# Patient Record
Sex: Female | Born: 1935 | Race: White | Hispanic: No | Smoking: Former smoker
Health system: Southern US, Community
[De-identification: ages and names within clinical notes are randomized; demographics above are authoritative.]

## PROBLEM LIST (undated history)

## (undated) DIAGNOSIS — I10 Essential (primary) hypertension: Secondary | ICD-10-CM

## (undated) DIAGNOSIS — I2699 Other pulmonary embolism without acute cor pulmonale: Secondary | ICD-10-CM

## (undated) HISTORY — PX: REPLACEMENT TOTAL KNEE: SUR1224

## (undated) HISTORY — PX: BACK SURGERY: SHX140

---

## 2019-03-20 ENCOUNTER — Other Ambulatory Visit: Payer: Self-pay | Admitting: Physician Assistant

## 2019-03-20 DIAGNOSIS — S22000A Wedge compression fracture of unspecified thoracic vertebra, initial encounter for closed fracture: Secondary | ICD-10-CM

## 2019-03-28 ENCOUNTER — Other Ambulatory Visit: Payer: Self-pay

## 2020-01-03 ENCOUNTER — Encounter (HOSPITAL_BASED_OUTPATIENT_CLINIC_OR_DEPARTMENT_OTHER): Payer: Self-pay

## 2020-01-03 ENCOUNTER — Emergency Department (HOSPITAL_BASED_OUTPATIENT_CLINIC_OR_DEPARTMENT_OTHER): Payer: Medicare Other

## 2020-01-03 ENCOUNTER — Other Ambulatory Visit: Payer: Self-pay

## 2020-01-03 ENCOUNTER — Emergency Department (HOSPITAL_BASED_OUTPATIENT_CLINIC_OR_DEPARTMENT_OTHER)
Admission: EM | Admit: 2020-01-03 | Discharge: 2020-01-03 | Disposition: A | Discharge status: Home / Self Care | Payer: Medicare Other | Attending: Emergency Medicine | Admitting: Emergency Medicine

## 2020-01-03 DIAGNOSIS — R2241 Localized swelling, mass and lump, right lower limb: Secondary | ICD-10-CM | POA: Diagnosis present

## 2020-01-03 DIAGNOSIS — Z86711 Personal history of pulmonary embolism: Secondary | ICD-10-CM | POA: Diagnosis not present

## 2020-01-03 DIAGNOSIS — Z7901 Long term (current) use of anticoagulants: Secondary | ICD-10-CM | POA: Insufficient documentation

## 2020-01-03 DIAGNOSIS — I10 Essential (primary) hypertension: Secondary | ICD-10-CM | POA: Insufficient documentation

## 2020-01-03 DIAGNOSIS — R609 Edema, unspecified: Secondary | ICD-10-CM

## 2020-01-03 DIAGNOSIS — M7989 Other specified soft tissue disorders: Secondary | ICD-10-CM

## 2020-01-03 DIAGNOSIS — Z87891 Personal history of nicotine dependence: Secondary | ICD-10-CM | POA: Insufficient documentation

## 2020-01-03 DIAGNOSIS — Z79899 Other long term (current) drug therapy: Secondary | ICD-10-CM | POA: Diagnosis not present

## 2020-01-03 HISTORY — DX: Other pulmonary embolism without acute cor pulmonale: I26.99

## 2020-01-03 HISTORY — DX: Essential (primary) hypertension: I10

## 2020-01-03 LAB — CBC WITH DIFFERENTIAL/PLATELET
Abs Immature Granulocytes: 0.05 10*3/uL (ref 0.00–0.07)
Basophils Absolute: 0 10*3/uL (ref 0.0–0.1)
Basophils Relative: 1 %
Eosinophils Absolute: 0.3 10*3/uL (ref 0.0–0.5)
Eosinophils Relative: 4 %
HCT: 30.6 % — ABNORMAL LOW (ref 36.0–46.0)
Hemoglobin: 10.3 g/dL — ABNORMAL LOW (ref 12.0–15.0)
Immature Granulocytes: 1 %
Lymphocytes Relative: 23 %
Lymphs Abs: 1.5 10*3/uL (ref 0.7–4.0)
MCH: 31.4 pg (ref 26.0–34.0)
MCHC: 33.7 g/dL (ref 30.0–36.0)
MCV: 93.3 fL (ref 80.0–100.0)
Monocytes Absolute: 0.7 10*3/uL (ref 0.1–1.0)
Monocytes Relative: 11 %
Neutro Abs: 4 10*3/uL (ref 1.7–7.7)
Neutrophils Relative %: 60 %
Platelets: 176 10*3/uL (ref 150–400)
RBC: 3.28 MIL/uL — ABNORMAL LOW (ref 3.87–5.11)
RDW: 14 % (ref 11.5–15.5)
WBC: 6.5 10*3/uL (ref 4.0–10.5)
nRBC: 0 % (ref 0.0–0.2)

## 2020-01-03 LAB — COMPREHENSIVE METABOLIC PANEL
ALT: 30 U/L (ref 0–44)
AST: 27 U/L (ref 15–41)
Albumin: 4 g/dL (ref 3.5–5.0)
Alkaline Phosphatase: 82 U/L (ref 38–126)
Anion gap: 9 (ref 5–15)
BUN: 32 mg/dL — ABNORMAL HIGH (ref 8–23)
CO2: 26 mmol/L (ref 22–32)
Calcium: 9.3 mg/dL (ref 8.9–10.3)
Chloride: 99 mmol/L (ref 98–111)
Creatinine, Ser: 0.97 mg/dL (ref 0.44–1.00)
GFR calc Af Amer: >60 mL/min (ref >60)
GFR calc non Af Amer: 54 mL/min — ABNORMAL LOW (ref >60)
Glucose, Bld: 102 mg/dL — ABNORMAL HIGH (ref 70–99)
Potassium: 5.1 mmol/L (ref 3.5–5.1)
Sodium: 134 mmol/L — ABNORMAL LOW (ref 135–145)
Total Bilirubin: 0.5 mg/dL (ref 0.3–1.2)
Total Protein: 6.4 g/dL — ABNORMAL LOW (ref 6.5–8.1)

## 2020-01-03 NOTE — ED Triage Notes (Signed)
Pt c/o intermittent right LE swelling-states she had recent blood clot in leg and lung-NAD-to triage using own rollater

## 2020-01-03 NOTE — ED Provider Notes (Signed)
[facility] EMERGENCY DEPARTMENT Provider Note   CSN: [id number] Arrival date & time: 01/03/20  1520     History Chief Complaint  Patient presents with  . Leg Swelling    Carly Carney is a 86 y.o. female presents today for swelling and color change of the right leg. Patient reports that in January 2021 she had steroid injection of her right knee for pain. Since that time she developed swelling and bruising of the right knee and lower leg. She denies any significant pain of the extremity. Patient reports that over the past couple of weeks the swelling and color change had been improving but they have worsened again starting 1-2 days ago, no clear inciting factors.  Patient reports that she was admitted to the hospital in March 2021 where she was diagnosed with PE. She has discharged on Eliquis and reports compliance with all medications. She reports that an ultrasound was not done of her leg.  Denies fever, chills, chest pain, SOB, cough/hemoptysis, abd pain, n/v/d, numbness, weakness, pain or any additional concerns. HPI     Past Medical History:  Diagnosis Date  . Hypertension   . Pulmonary emboli (HCC)     There are no problems to display for this patient.   Past Surgical History:  Procedure Laterality Date  . BACK SURGERY    . REPLACEMENT TOTAL KNEE       OB History   No obstetric history on file.     No family history on file.  Social History   Tobacco Use  . Smoking status: Former Games developer  . Smokeless tobacco: Never Used  Substance Use Topics  . Alcohol use: Never  . Drug use: Never    Home Medications Prior to Admission medications   Medication Sig Start Date End Date Taking? Authorizing Provider  apixaban (ELIQUIS) 5 MG TABS tablet Take by mouth. 11/15/19  Yes [provider]  Buprenorphine 15 MCG/HR PTWK Place onto the skin. 01/01/20  Yes [provider]  denosumab (PROLIA) 60 MG/ML SOSY injection Inject into the skin. 08/03/17   Yes [provider]  meloxicam (MOBIC) 7.5 MG tablet  12/08/16  Yes [provider]  methocarbamol (ROBAXIN) 500 MG tablet Take by mouth. 10/05/16  Yes [provider]  oxyCODONE-acetaminophen (PERCOCET) 7.5-325 MG tablet Take by mouth. 04/06/17 02/04/20 Yes [provider]  pantoprazole (PROTONIX) 40 MG tablet Take by mouth. 02/10/18 03/26/20 Yes [provider]  ALPRAZolam Prudy Feeler) 0.5 MG tablet SMARTSIG:0.5-1 Tablet(s) By Mouth 1 to 4 Times Daily 12/26/19   [provider]  b complex vitamins tablet Take by mouth.    [provider]  Cholecalciferol 25 MCG (1000 UT) tablet Take by mouth.    [provider]  furosemide (LASIX) 20 MG tablet Take 10 mg by mouth daily. 08/21/19   [provider]  losartan (COZAAR) 100 MG tablet Take 100 mg by mouth daily. 11/29/19   [provider]  metoprolol succinate (TOPROL-XL) 50 MG 24 hr tablet Take 50 mg by mouth 2 (two) times daily. 10/25/19   [provider]  Multiple Vitamins tablet Take by mouth.    [provider]  PREMARIN 0.3 MG tablet Take 0.3 mg by mouth daily. 09/14/19   [provider]    Allergies    Patient has no known allergies.  Review of Systems   Review of Systems Ten systems are reviewed and are negative for acute change except as noted in the HPI   Physical  Exam Updated Vital Signs BP (!) 181/80   Pulse 75   Temp 97.7 F (36.5 C) (Oral)   Resp 16   Ht 5' 3.5" (1.613 m)   Wt 59 kg   SpO2 99%   BMI 22.67 kg/m   Physical Exam Constitutional:      General: She is not in acute distress.    Appearance: Normal appearance. She is well-developed. She is not ill-appearing or diaphoretic.  HENT:     Head: Normocephalic and atraumatic.     Right Ear: External ear normal.     Left Ear: External ear normal.     Nose: Nose normal.  Eyes:     General: Vision grossly intact. Gaze aligned appropriately.     Pupils: Pupils are  equal, round, and reactive to light.  Neck:     Trachea: Trachea and phonation normal. No tracheal deviation.  Pulmonary:     Effort: Pulmonary effort is normal. No respiratory distress.  Abdominal:     General: There is no distension.     Palpations: Abdomen is soft.     Tenderness: There is no abdominal tenderness. There is no guarding or rebound.  Musculoskeletal:        General: No tenderness. Normal range of motion.     Cervical back: Normal range of motion.     Right lower leg: Edema present.     Left lower leg: No edema.     Comments: Swelling and darkening of the right lower leg as pictured below. No induration or fluctuance. No increased warmth. Motion at the right knee appropriate for age without increase in pain. Motion at the right ankle intact and without pain. Capillary refill and sensation intact to all toes. Pedal pulses intact and equal.  Skin:    General: Skin is warm and dry.  Neurological:     Mental Status: She is alert.     GCS: GCS eye subscore is 4. GCS verbal subscore is 5. GCS motor subscore is 6.     Comments: Speech is clear and goal oriented, follows commands Major Cranial nerves without deficit, no facial droop Moves extremities without ataxia, coordination intact  Psychiatric:        Behavior: Behavior normal.       ED Results / Procedures / Treatments   Labs (all labs ordered are listed, but only abnormal results are displayed) Labs Reviewed  CBC WITH DIFFERENTIAL/PLATELET - Abnormal; Notable for the following components:      Result Value   RBC 3.28 (*)    Hemoglobin 10.3 (*)    HCT 30.6 (*)    All other components within normal limits  COMPREHENSIVE METABOLIC PANEL - Abnormal; Notable for the following components:   Sodium 134 (*)    Glucose, Bld 102 (*)    BUN 32 (*)    Total Protein 6.4 (*)    GFR calc non Af Amer 54 (*)    All other components within normal limits    EKG None  Radiology US Venous Img Lower Bilateral  (DVT)  Result Date: 01/03/2020 CLINICAL DATA:  Right knee and leg swelling EXAM: Bilateral LOWER EXTREMITY VENOUS DOPPLER ULTRASOUND TECHNIQUE: Gray-scale sonography with compression, as well as color and duplex ultrasound, were performed to evaluate the deep venous system(s) from the level of the common femoral vein through the popliteal and proximal calf veins. COMPARISON:  09/20/2019 FINDINGS: VENOUS Normal compressibility of the common femoral, superficial femoral, and popliteal veins, as well as the visualized calf  veins. Visualized portions of profunda femoral vein and great saphenous vein unremarkable. No filling defects to suggest DVT on grayscale or color Doppler imaging. Doppler waveforms show normal direction of venous flow, normal respiratory plasticity and response to augmentation. OTHER Heterogeneous complex cystic mass in the medial right knee measuring 5.2 x 7.6 x 3.7 cm. Limitations: none IMPRESSION: 1. Negative for lower extremity DVT 2. 7.6 cm complex cystic mass in the medial right knee. A large complex collection was noted within the right lower leg on 90/24/0973 and it is uncertain if this represents slow resolution of this finding or recurrent/new collection. Hematoma was previously considered, though other complex cystic soft tissue mass is not excluded. MRI would better evaluate the abnormality. Electronically Signed   By: Donavan Foil M.D.   On: 01/03/2020 17:22    Procedures Procedures (including critical care time)  Medications Ordered in ED Medications - No data to display  ED Course  I have reviewed the triage vital signs and the nursing notes.  Pertinent labs & imaging results that were available during my care of the patient were reviewed by me and considered in my medical decision making (see chart for details).    MDM Rules/Calculators/A&P                     I have reviewed and interpreted the following labs.  CBC shows no leukocytosis to suggest infection,  hemoglobin of 10.3. CMP shows no emergent electrolyte derangements, evidence of kidney injury or elevation of LFTs.  Bilateral Lower Extremity DVT studies:  IMPRESSION:  1. Negative for lower extremity DVT  2. 7.6 cm complex cystic mass in the medial right knee. A large  complex collection was noted within the right lower leg on  53/29/9242 and it is uncertain if this represents slow resolution of  this finding or recurrent/new collection. Hematoma was previously  considered, though other complex cystic soft tissue mass is not  excluded. MRI would better evaluate the abnormality.  = Patient seen and evaluated by Dr. Geurts Singer, suspect this to be a hematoma. No evidence of cellulitis, DVT, septic arthritis, compartment syndrome, neurovascular compromise or other emergent pathologies at this time. No indication for antibiotics. Patient to use RICE therapy and follow-up with PCP to schedule outpatient MRI. No indication for emergent MRI at this time.   At this time there does not appear to be any evidence of an acute emergency medical condition and the patient appears stable for discharge with appropriate outpatient follow up. Diagnosis was discussed with patient who verbalizes understanding of care plan and is agreeable to discharge. I have discussed return precautions with patient who verbalizes understanding. Patient encouraged to follow-up with their PCP. All questions answered.   Note: Portions of this report may have been transcribed using voice recognition software. Every effort was made to ensure accuracy; however, inadvertent computerized transcription errors may still be present. Final Clinical Impression(s) / ED Diagnoses Final diagnoses:  Swelling    Rx / DC Orders ED Discharge Orders    None       Gari Crown 01/03/20 Greer Ee    Virgel Manifold, MD 01/04/20 1818

## 2020-01-03 NOTE — Discharge Instructions (Addendum)
At this time there does not appear to be the presence of an emergent medical condition, however there is always the potential for conditions to change. Please read and follow the below instructions.  Please return to the Emergency Department immediately for any new or worsening symptoms. Please be sure to follow up with your Primary Care Provider within one week for recheck. You will likely need an MRI of your leg for further evaluation of the fluid collection. Use rest, ice and elevation to help with your symptoms  Get help right away if: You have shortness of breath or chest pain. You cannot breathe when you lie down. You have pain, redness, or warmth in the swollen areas. You have heart, liver, or kidney disease and get edema all of a sudden. You have a fever or chills You have numbness or weakness You have any new/concerning or worsening of symptoms   Please read the additional information packets attached to your discharge summary.  Do not take your medicine if  develop an itchy rash, swelling in your mouth or lips, or difficulty breathing; call 911 and seek immediate emergency medical attention if this occurs.  Note: Portions of this text may have been transcribed using voice recognition software. Every effort was made to ensure accuracy; however, inadvertent computerized transcription errors may still be present.

## 2020-01-03 NOTE — ED Notes (Signed)
amb to BR with walker w/o difficulty

## 2020-04-18 ENCOUNTER — Emergency Department (HOSPITAL_BASED_OUTPATIENT_CLINIC_OR_DEPARTMENT_OTHER)
Admission: EM | Admit: 2020-04-18 | Discharge: 2020-04-18 | Disposition: A | Discharge status: Home / Self Care | Payer: Medicare Other | Attending: Emergency Medicine | Admitting: Emergency Medicine

## 2020-04-18 ENCOUNTER — Emergency Department (HOSPITAL_BASED_OUTPATIENT_CLINIC_OR_DEPARTMENT_OTHER): Payer: Medicare Other

## 2020-04-18 ENCOUNTER — Other Ambulatory Visit: Payer: Self-pay

## 2020-04-18 ENCOUNTER — Encounter (HOSPITAL_BASED_OUTPATIENT_CLINIC_OR_DEPARTMENT_OTHER): Payer: Self-pay | Admitting: *Deleted

## 2020-04-18 DIAGNOSIS — Z79899 Other long term (current) drug therapy: Secondary | ICD-10-CM | POA: Diagnosis not present

## 2020-04-18 DIAGNOSIS — Y999 Unspecified external cause status: Secondary | ICD-10-CM | POA: Diagnosis not present

## 2020-04-18 DIAGNOSIS — W19XXXA Unspecified fall, initial encounter: Secondary | ICD-10-CM | POA: Diagnosis not present

## 2020-04-18 DIAGNOSIS — Y939 Activity, unspecified: Secondary | ICD-10-CM | POA: Diagnosis not present

## 2020-04-18 DIAGNOSIS — Z87891 Personal history of nicotine dependence: Secondary | ICD-10-CM | POA: Insufficient documentation

## 2020-04-18 DIAGNOSIS — Y929 Unspecified place or not applicable: Secondary | ICD-10-CM | POA: Diagnosis not present

## 2020-04-18 DIAGNOSIS — S34109A Unspecified injury to unspecified level of lumbar spinal cord, initial encounter: Secondary | ICD-10-CM | POA: Diagnosis present

## 2020-04-18 DIAGNOSIS — S39012A Strain of muscle, fascia and tendon of lower back, initial encounter: Secondary | ICD-10-CM

## 2020-04-18 DIAGNOSIS — I10 Essential (primary) hypertension: Secondary | ICD-10-CM | POA: Insufficient documentation

## 2020-04-18 DIAGNOSIS — Z96653 Presence of artificial knee joint, bilateral: Secondary | ICD-10-CM | POA: Diagnosis not present

## 2020-04-18 MED ORDER — ACETAMINOPHEN 500 MG PO TABS
500.0000 mg | ORAL_TABLET | Freq: Once | ORAL | Status: AC
  Administered 2020-04-18: 500 mg via ORAL
  Filled 2020-04-18: qty 1

## 2020-04-18 NOTE — ED Triage Notes (Signed)
To ED via EMS c.o fall with back injury. She tripped over a rug that caused the fall. She is a resident of [place].

## 2020-04-18 NOTE — ED Provider Notes (Signed)
[facility] EMERGENCY DEPARTMENT Provider Note   CSN: [id number] Arrival date & time: 04/18/20  1337     History Chief Complaint  Patient presents with  . Fall    Carly Carney is a 86 y.o. female.  Presents to the emergency department after fall.  Patient reports that she fell around 1:00 this afternoon, landed on her right lower back against a dresser.  She uses a Occupational hygienist and had asked security to help her up.  She was able to walk with Rollator after the fall.  Has been having dull achy mild low back pain ever since fall.  Uses Eliquis for pulmonary embolism history.  She denies any other acute complaints.  Specifically she denies hitting her head, no loss of consciousness.  HPI     Past Medical History:  Diagnosis Date  . Hypertension   . Pulmonary emboli (HCC)     There are no problems to display for this patient.   Past Surgical History:  Procedure Laterality Date  . BACK SURGERY    . REPLACEMENT TOTAL KNEE       OB History   No obstetric history on file.     No family history on file.  Social History   Tobacco Use  . Smoking status: Former Games developer  . Smokeless tobacco: Never Used  Substance Use Topics  . Alcohol use: Never  . Drug use: Never    Home Medications Prior to Admission medications   Medication Sig Start Date End Date Taking? Authorizing Provider  ALPRAZolam Prudy Feeler) 0.5 MG tablet SMARTSIG:0.5-1 Tablet(s) By Mouth 1 to 4 Times Daily 12/26/19   [provider]  apixaban (ELIQUIS) 5 MG TABS tablet Take by mouth. 11/15/19   [provider]  b complex vitamins tablet Take by mouth.    [provider]  Buprenorphine 15 MCG/HR PTWK Place onto the skin. 01/01/20   [provider]  Cholecalciferol 25 MCG (1000 UT) tablet Take by mouth.    [provider]  denosumab (PROLIA) 60 MG/ML SOSY injection Inject into the skin. 08/03/17   [provider]  furosemide (LASIX) 20 MG tablet Take 10 mg by  mouth daily. 08/21/19   [provider]  losartan (COZAAR) 100 MG tablet Take 100 mg by mouth daily. 11/29/19   [provider]  meloxicam (MOBIC) 7.5 MG tablet  12/08/16   [provider]  methocarbamol (ROBAXIN) 500 MG tablet Take by mouth. 10/05/16   [provider]  metoprolol succinate (TOPROL-XL) 50 MG 24 hr tablet Take 50 mg by mouth 2 (two) times daily. 10/25/19   [provider]  Multiple Vitamins tablet Take by mouth.    [provider]  pantoprazole (PROTONIX) 40 MG tablet Take by mouth. 02/10/18 03/26/20  [provider]  PREMARIN 0.3 MG tablet Take 0.3 mg by mouth daily. 09/14/19   [provider]    Allergies    Patient has no known allergies.  Review of Systems   Review of Systems  Constitutional: Negative for chills and fever.  HENT: Negative for ear pain and sore throat.   Eyes: Negative for pain and visual disturbance.  Respiratory: Negative for cough and shortness of breath.   Cardiovascular: Negative for chest pain and palpitations.  Gastrointestinal: Negative for abdominal pain and vomiting.  Genitourinary: Negative for dysuria and hematuria.  Musculoskeletal: Positive for back pain. Negative for arthralgias.  Skin: Negative for color change and rash.  Neurological: Negative for seizures and syncope.  All  other systems reviewed and are negative.   Physical Exam Updated Vital Signs BP (!) 156/87 (BP Location: Right Arm)   Pulse 76   Temp (!) 97.3 F (36.3 C) (Oral)   Resp 16   Ht 5' 3.5" (1.613 m)   Wt 59 kg   SpO2 100%   BMI 22.68 kg/m   Physical Exam Vitals and nursing note reviewed.  Constitutional:      General: She is not in acute distress.    Appearance: She is well-developed.  HENT:     Head: Normocephalic and atraumatic.  Eyes:     Conjunctiva/sclera: Conjunctivae normal.  Cardiovascular:     Rate and Rhythm: Normal rate and regular rhythm.     Heart sounds: No murmur heard.     Pulmonary:     Effort: Pulmonary effort is normal. No respiratory distress.     Breath sounds: Normal breath sounds.  Abdominal:     Palpations: Abdomen is soft.     Tenderness: There is no abdominal tenderness.  Musculoskeletal:     Cervical back: Neck supple.     Comments: Back: No tenderness to palpation over C, T spine, there is mild tenderness over mid L-spine, there is superficial abrasion just right lateral L-spine, no hematoma, no ecchymosis, no generalized flank tenderness Extremities: There is no tenderness to palpation throughout all 4 extremities, normal range of motion, no deformity or ecchymosis appreciated, distal pulses intact  Skin:    General: Skin is warm and dry.  Neurological:     Mental Status: She is alert.     ED Results / Procedures / Treatments   Labs (all labs ordered are listed, but only abnormal results are displayed) Labs Reviewed - No data to display  EKG None  Radiology DG Lumbar Spine Complete  Result Date: 04/18/2020 CLINICAL DATA:  Larey Seat, low back pain EXAM: LUMBAR SPINE - COMPLETE 4+ VIEW COMPARISON:  03/23/2019 FINDINGS: Frontal, bilateral oblique, and lateral views of the lumbar spine are obtained. There is S-shaped scoliosis of the thoracolumbar spine, with previous posterior fusion spanning T10 through S1. There are no acute bony abnormalities. No evidence of metallic hardware failure or loosening. Bones are diffusely osteopenic. IMPRESSION: 1. Extensive postsurgical changes of the thoracolumbar spine, with no evidence of hardware failure or loosening. 2. Osteopenia.  No acute fracture. Electronically Signed   By: Sharlet Salina M.D.   On: 04/18/2020 16:54    Procedures Procedures (including critical care time)  Medications Ordered in ED Medications  acetaminophen (TYLENOL) tablet 500 mg (500 mg Oral Given 04/18/20 1654)    ED Course  I have reviewed the triage vital signs and the nursing notes.  Pertinent labs & imaging results that  were available during my care of the patient were reviewed by me and considered in my medical decision making (see chart for details).    MDM Rules/Calculators/A&P                          86 year old lady who presented to ER after mechanical fall.  Noted to have some mild tenderness over her lower L-spine and the superficial abrasion, on Eliquis, bleeding controlled with direct pressure.  Plain films negative.  Suspect MSK strain, will discharge home.    After the discussed management above, the patient was determined to be safe for discharge.  The patient was in agreement with this plan and all questions regarding their care were answered.  ED return precautions were discussed  and the patient will return to the ED with any significant worsening of condition.    Final Clinical Impression(s) / ED Diagnoses Final diagnoses:  Fall, initial encounter  Strain of lumbar region, initial encounter    Rx / DC Orders ED Discharge Orders    None       Milagros Loll, MD 04/19/20 [phone]

## 2020-04-18 NOTE — ED Notes (Signed)
Pt. Reports she fell today against a dresser at about 1pm.  Pt. Reports she was going into her restroom and she called security to help her up due to she uses a Rolater walker for walking.  Pt. Reports she came in with the walker and is still able to walk with the Rolater walker.  Pt. Report she is concerned about the hardware in her back due to the fall against her dresser.  Pt. Has noted abrasion on the lower back with a band aid on the abrasion.  Some bleeding under the band aid.

## 2020-04-18 NOTE — Discharge Instructions (Signed)
Recommend Tylenol as needed for pain control.  Use gauze dressing as discussed.  If you have worsening bleeding, develop worsening pain, inability to walk or other new concerning symptom, please return to ER for reassessment.

## 2020-06-13 DIAGNOSIS — Z87891 Personal history of nicotine dependence: Secondary | ICD-10-CM | POA: Diagnosis not present

## 2020-06-13 DIAGNOSIS — R10814 Left lower quadrant abdominal tenderness: Secondary | ICD-10-CM | POA: Insufficient documentation

## 2020-06-13 DIAGNOSIS — R1030 Lower abdominal pain, unspecified: Secondary | ICD-10-CM | POA: Diagnosis not present

## 2020-06-13 DIAGNOSIS — K5900 Constipation, unspecified: Secondary | ICD-10-CM | POA: Insufficient documentation

## 2020-06-13 DIAGNOSIS — Z79899 Other long term (current) drug therapy: Secondary | ICD-10-CM | POA: Diagnosis not present

## 2020-06-13 DIAGNOSIS — Z7901 Long term (current) use of anticoagulants: Secondary | ICD-10-CM | POA: Insufficient documentation

## 2020-06-13 DIAGNOSIS — I10 Essential (primary) hypertension: Secondary | ICD-10-CM | POA: Diagnosis not present

## 2020-06-14 ENCOUNTER — Emergency Department (HOSPITAL_BASED_OUTPATIENT_CLINIC_OR_DEPARTMENT_OTHER): Payer: Medicare Other

## 2020-06-14 ENCOUNTER — Encounter (HOSPITAL_BASED_OUTPATIENT_CLINIC_OR_DEPARTMENT_OTHER): Payer: Self-pay | Admitting: Emergency Medicine

## 2020-06-14 ENCOUNTER — Other Ambulatory Visit: Payer: Self-pay

## 2020-06-14 ENCOUNTER — Emergency Department (HOSPITAL_BASED_OUTPATIENT_CLINIC_OR_DEPARTMENT_OTHER)
Admission: EM | Admit: 2020-06-14 | Discharge: 2020-06-14 | Disposition: A | Discharge status: Home / Self Care | Payer: Medicare Other | Attending: Emergency Medicine | Admitting: Emergency Medicine

## 2020-06-14 DIAGNOSIS — D649 Anemia, unspecified: Secondary | ICD-10-CM

## 2020-06-14 DIAGNOSIS — K5900 Constipation, unspecified: Secondary | ICD-10-CM | POA: Diagnosis not present

## 2020-06-14 DIAGNOSIS — N289 Disorder of kidney and ureter, unspecified: Secondary | ICD-10-CM

## 2020-06-14 DIAGNOSIS — Z7901 Long term (current) use of anticoagulants: Secondary | ICD-10-CM

## 2020-06-14 LAB — URINALYSIS, ROUTINE W REFLEX MICROSCOPIC
Bilirubin Urine: NEGATIVE
Glucose, UA: NEGATIVE mg/dL
Hgb urine dipstick: NEGATIVE
Ketones, ur: NEGATIVE mg/dL
Leukocytes,Ua: NEGATIVE
Nitrite: NEGATIVE
Protein, ur: NEGATIVE mg/dL
Specific Gravity, Urine: <1.005 — ABNORMAL LOW (ref 1.005–1.030)
pH: 6.5 (ref 5.0–8.0)

## 2020-06-14 LAB — CBC WITH DIFFERENTIAL/PLATELET
Abs Immature Granulocytes: 0.09 10*3/uL — ABNORMAL HIGH (ref 0.00–0.07)
Basophils Absolute: 0.1 10*3/uL (ref 0.0–0.1)
Basophils Relative: 1 %
Eosinophils Absolute: 0 10*3/uL (ref 0.0–0.5)
Eosinophils Relative: 0 %
HCT: 32.2 % — ABNORMAL LOW (ref 36.0–46.0)
Hemoglobin: 11 g/dL — ABNORMAL LOW (ref 12.0–15.0)
Immature Granulocytes: 1 %
Lymphocytes Relative: 21 %
Lymphs Abs: 2 10*3/uL (ref 0.7–4.0)
MCH: 32.5 pg (ref 26.0–34.0)
MCHC: 34.2 g/dL (ref 30.0–36.0)
MCV: 95.3 fL (ref 80.0–100.0)
Monocytes Absolute: 0.9 10*3/uL (ref 0.1–1.0)
Monocytes Relative: 9 %
Neutro Abs: 6.6 10*3/uL (ref 1.7–7.7)
Neutrophils Relative %: 68 %
Platelets: 210 10*3/uL (ref 150–400)
RBC: 3.38 MIL/uL — ABNORMAL LOW (ref 3.87–5.11)
RDW: 14.4 % (ref 11.5–15.5)
WBC: 9.6 10*3/uL (ref 4.0–10.5)
nRBC: 0 % (ref 0.0–0.2)

## 2020-06-14 LAB — COMPREHENSIVE METABOLIC PANEL
ALT: 20 U/L (ref 0–44)
AST: 21 U/L (ref 15–41)
Albumin: 3.8 g/dL (ref 3.5–5.0)
Alkaline Phosphatase: 78 U/L (ref 38–126)
Anion gap: 8 (ref 5–15)
BUN: 35 mg/dL — ABNORMAL HIGH (ref 8–23)
CO2: 25 mmol/L (ref 22–32)
Calcium: 9.2 mg/dL (ref 8.9–10.3)
Chloride: 98 mmol/L (ref 98–111)
Creatinine, Ser: 1.05 mg/dL — ABNORMAL HIGH (ref 0.44–1.00)
GFR, Estimated: 48 mL/min — ABNORMAL LOW (ref >60)
Glucose, Bld: 102 mg/dL — ABNORMAL HIGH (ref 70–99)
Potassium: 4.6 mmol/L (ref 3.5–5.1)
Sodium: 131 mmol/L — ABNORMAL LOW (ref 135–145)
Total Bilirubin: 0.5 mg/dL (ref 0.3–1.2)
Total Protein: 6.4 g/dL — ABNORMAL LOW (ref 6.5–8.1)

## 2020-06-14 LAB — LIPASE, BLOOD: Lipase: 43 U/L (ref 11–51)

## 2020-06-14 MED ORDER — IOHEXOL 300 MG/ML  SOLN
75.0000 mL | Freq: Once | INTRAMUSCULAR | Status: AC | PRN
  Administered 2020-06-14: 75 mL via INTRAVENOUS

## 2020-06-14 MED ORDER — BISACODYL 5 MG PO TBEC
5.0000 mg | DELAYED_RELEASE_TABLET | Freq: Once | ORAL | Status: AC
  Administered 2020-06-14: 5 mg via ORAL
  Filled 2020-06-14: qty 1

## 2020-06-14 NOTE — ED Triage Notes (Signed)
Patient arrived via POV c/o constipation with abdominal pain x 1 week. Patient states small stools with mucus. Patient took 1 dose miralax on 06/10/20. Patient states pain on LLQ with increased tenderness and firmness. Patient also states being nauseous x 1 week. Patient is AO x 4, VS WDL, weak gait in wheelchair.

## 2020-06-14 NOTE — Discharge Instructions (Addendum)
You may increase the frequency you are taking Miralax as needed.  Return if you are having any problems.

## 2020-06-14 NOTE — ED Provider Notes (Signed)
[facility] EMERGENCY DEPARTMENT Provider Note   CSN: [id number] Arrival date & time: 06/13/20  2343   History Chief Complaint  Patient presents with  . Constipation  . Abdominal Pain    Carly Carney is a 86 y.o. female.  The history is provided by the patient.  Constipation Associated symptoms: abdominal pain   Abdominal Pain Associated symptoms: constipation   She has history of hypertension, pulmonary embolism anticoagulated on apixaban and comes in complaining of possible bowel obstruction.  She states that she has not had a bowel movement for the last 3days.  She has been passing a small amount of mucus and a small amount of flatus.  Abdomen is slightly distended.  There has been some nausea but no vomiting.  She initially denied abdominal pain to me, but then did admit to some lower abdominal pain, especially after she took a dose of polyethylene glycol.  She denies urinary symptoms.  She denies fever or chills.  Past Medical History:  Diagnosis Date  . Hypertension   . Pulmonary emboli (HCC)     There are no problems to display for this patient.   Past Surgical History:  Procedure Laterality Date  . BACK SURGERY    . REPLACEMENT TOTAL KNEE       OB History   No obstetric history on file.     No family history on file.  Social History   Tobacco Use  . Smoking status: Former Games developer  . Smokeless tobacco: Never Used  Substance Use Topics  . Alcohol use: Never  . Drug use: Never    Home Medications Prior to Admission medications   Medication Sig Start Date End Date Taking? Authorizing Provider  ALPRAZolam Prudy Feeler) 0.5 MG tablet SMARTSIG:0.5-1 Tablet(s) By Mouth 1 to 4 Times Daily 12/26/19   [provider]  apixaban (ELIQUIS) 5 MG TABS tablet Take by mouth. 11/15/19   [provider]  b complex vitamins tablet Take by mouth.    [provider]  Buprenorphine 15 MCG/HR PTWK Place onto the skin. 01/01/20   [provider]  Cholecalciferol 25 MCG (1000 UT) tablet Take by mouth.    [provider]  denosumab (PROLIA) 60 MG/ML SOSY injection Inject into the skin. 08/03/17   [provider]  furosemide (LASIX) 20 MG tablet Take 10 mg by mouth daily. 08/21/19   [provider]  losartan (COZAAR) 100 MG tablet Take 100 mg by mouth daily. 11/29/19   [provider]  meloxicam (MOBIC) 7.5 MG tablet  12/08/16   [provider]  methocarbamol (ROBAXIN) 500 MG tablet Take by mouth. 10/05/16   [provider]  metoprolol succinate (TOPROL-XL) 50 MG 24 hr tablet Take 50 mg by mouth 2 (two) times daily. 10/25/19   [provider]  Multiple Vitamins tablet Take by mouth.    [provider]  pantoprazole (PROTONIX) 40 MG tablet Take by mouth. 02/10/18 03/26/20  [provider]  PREMARIN 0.3 MG tablet Take 0.3 mg by mouth daily. 09/14/19   [provider]    Allergies    Patient has no known allergies.  Review of Systems   Review of Systems  Gastrointestinal: Positive for abdominal pain and constipation.  All other systems reviewed and are negative.   Physical Exam Updated Vital Signs BP 132/60 (BP Location: Right Arm)   Pulse 67   Temp 98.4 F (36.9 C) (Oral)   Resp 16   Ht 5\' 3"  (1.6 m)  Wt 59 kg   SpO2 96%   BMI 23.03 kg/m   Physical Exam Vitals and nursing note reviewed.   86 year old female, resting comfortably and in no acute distress. Vital signs are normal. Oxygen saturation is 96%, which is normal. Head is normocephalic and atraumatic. PERRLA, EOMI. Oropharynx is clear. Neck is nontender and supple without adenopathy or JVD. Back is nontender and there is no CVA tenderness. Lungs are clear without rales, wheezes, or rhonchi. Chest is nontender. Heart has regular rate and rhythm without murmur. Abdomen is soft, flat, with mild tenderness in the suprapubic area and left lower quadrant.  There is no  rebound or guarding.  There are no masses or hepatosplenomegaly and peristalsis is hypoactive. Extremities have no cyanosis or edema, full range of motion is present. Skin is warm and dry without rash. Neurologic: Mental status is normal, cranial nerves are intact, there are no motor or sensory deficits.  ED Results / Procedures / Treatments   Labs (all labs ordered are listed, but only abnormal results are displayed) Labs Reviewed  CBC WITH DIFFERENTIAL/PLATELET - Abnormal; Notable for the following components:      Result Value   RBC 3.38 (*)    Hemoglobin 11.0 (*)    HCT 32.2 (*)    Abs Immature Granulocytes 0.09 (*)    All other components within normal limits  COMPREHENSIVE METABOLIC PANEL - Abnormal; Notable for the following components:   Sodium 131 (*)    Glucose, Bld 102 (*)    BUN 35 (*)    Creatinine, Ser 1.05 (*)    Total Protein 6.4 (*)    GFR, Estimated 48 (*)    All other components within normal limits  URINALYSIS, ROUTINE W REFLEX MICROSCOPIC - Abnormal; Notable for the following components:   Specific Gravity, Urine <1.005 (*)    All other components within normal limits  LIPASE, BLOOD   Radiology CT ABDOMEN PELVIS W CONTRAST  Result Date: 06/14/2020 CLINICAL DATA:  Left lower quadrant abdominal pain EXAM: CT ABDOMEN AND PELVIS WITH CONTRAST TECHNIQUE: Multidetector CT imaging of the abdomen and pelvis was performed using the standard protocol following bolus administration of intravenous contrast. CONTRAST:  85mL OMNIPAQUE IOHEXOL 300 MG/ML  SOLN COMPARISON:  PET-CT dated 01/12/2019 FINDINGS: Lower chest: Minimal subpleural patchy opacities at the lung bases, favoring sequela of prior atypical infection/inflammation. Hepatobiliary: Liver is within normal limits. Gallbladder is unremarkable. No intrahepatic or extrahepatic ductal dilatation. Pancreas: Within normal limits. Spleen: Within normal limits. Adrenals/Urinary Tract: Adrenal glands are within normal limits.  13 mm posterior right upper pole renal cyst. Left kidney is within normal limits. No hydronephrosis. Bladder is within normal limits. Stomach/Bowel: Stomach is within normal limits. No evidence of bowel obstruction. Appendix is not discretely visualized. Moderate left colonic stool burden, suggesting mild constipation. No colonic wall thickening or inflammatory changes. Specifically, no evidence of diverticulitis. Vascular/Lymphatic: No evidence of abdominal aortic aneurysm. Atherosclerotic calcifications of the abdominal aorta and branch vessels. No suspicious abdominopelvic lymphadenopathy. Reproductive: Status post hysterectomy. No adnexal masses. Other: No abdominopelvic ascites. Musculoskeletal: Lumbar dextroscoliosis with thoracolumbar fixation rods. IMPRESSION: No colonic wall thickening or inflammatory changes. Specifically, no evidence of diverticulitis. Moderate left colonic stool burden, suggesting mild constipation. Additional ancillary findings as above. Electronically Signed   By: Charline Bills M.D.   On: 06/14/2020 06:05   DG Abd Portable 2 Views  Result Date: 06/14/2020 CLINICAL DATA:  Constipation EXAM: PORTABLE ABDOMEN - 2 VIEW COMPARISON:  None. FINDINGS: The bowel  gas pattern is normal. There is no evidence of free air. No radio-opaque calculi or other significant radiographic abnormality is seen. Long segment spinal rods. IMPRESSION: Negative. Electronically Signed   By: Deatra Robinson M.D.   On: 06/14/2020 04:44    Procedures Procedures   Medications Ordered in ED Medications  bisacodyl (DULCOLAX) EC tablet 5 mg (has no administration in time range)  iohexol (OMNIPAQUE) 300 MG/ML solution 75 mL (75 mLs Intravenous Contrast Given 06/14/20 0545)    ED Course  I have reviewed the triage vital signs and the nursing notes.  Pertinent labs & imaging results that were available during my care of the patient were reviewed by me and considered in my medical decision making (see  chart for details).  MDM Rules/Calculators/A&P Constipation with lower abdominal pain.  Doubt bowel obstruction in the absence of history of abdominal surgery.  Consider possibility of diverticulitis.  Initial lab work is reassuring and plain abdominal films are unremarkable.  Mild hyponatremia is not felt to be clinically significant.  BUN and creatinine are minimally elevated but not significantly changed from prior.  Mild anemia is present which is actually improved over prior.  Will send for CT of abdomen and pelvis.  Old records are reviewed, and there are no relevant past visits.  Urinalysis is unremarkable.  CT of abdomen and pelvis showed increased stool burden, no other acute process.  She is given a dose of bisacodyl and is discharged with instructions to increase dose of polyethylene glycol as needed.  Return precautions discussed.  Final Clinical Impression(s) / ED Diagnoses Final diagnoses:  Constipation, unspecified constipation type  Normochromic normocytic anemia  Renal insufficiency  Chronic anticoagulation    Rx / DC Orders ED Discharge Orders    None       Dione Booze, MD 06/14/20 [phone]

## 2021-05-12 IMAGING — CT CT ABD-PELV W/ CM
2 of 5 series · 17 of 46 positions shown, 19 images · IV contrast (Omnipaque)
Comparison: PET-CT dated 01/12/2019

CLINICAL DATA: Left lower quadrant abdominal pain

EXAM:
CT ABDOMEN AND PELVIS WITH CONTRAST
TECHNIQUE: Multidetector CT imaging of the abdomen and pelvis was performed
using the standard protocol following bolus administration of
intravenous contrast.
CONTRAST:  75mL OMNIPAQUE IOHEXOL 300 MG/ML  SOLN

[Series 2: axial (person_name) (person_name) · axial · 0.72mm/px · z∈[-533,-193]mm · 14 of 77 slices shown, 16 images]
[im 5/77  soft-tissue]
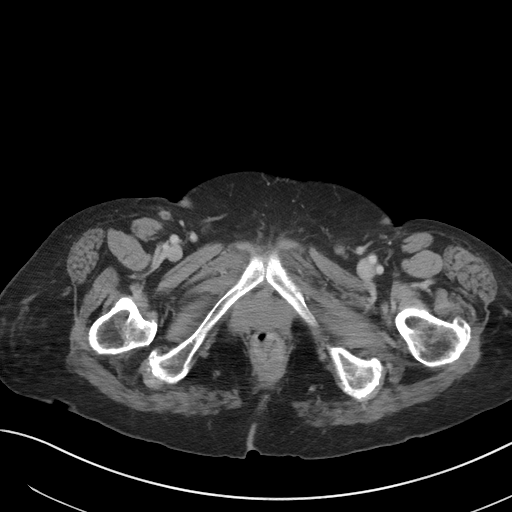
[im 5/77  bone]
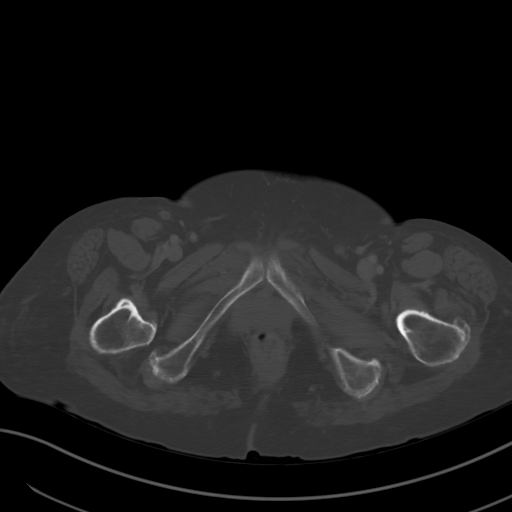
[im 9/77  soft-tissue]
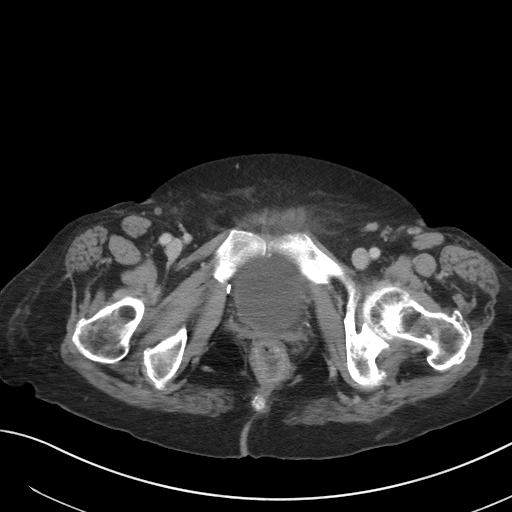
[im 17/77  soft-tissue]
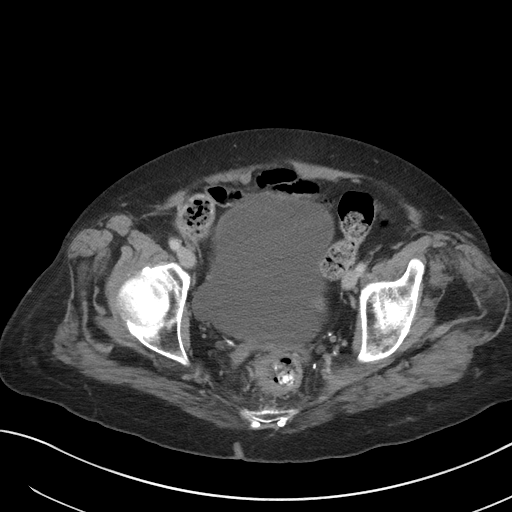
[im 21/77  soft-tissue]
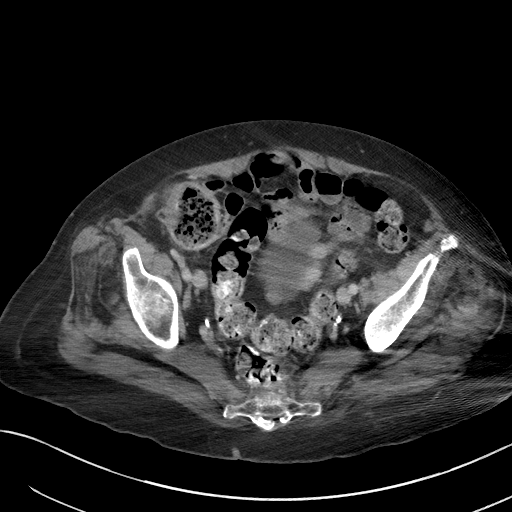
[im 25/77  soft-tissue]
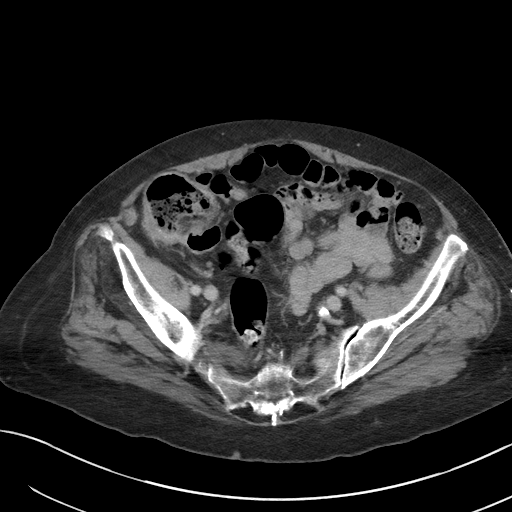
[im 33/77  soft-tissue]
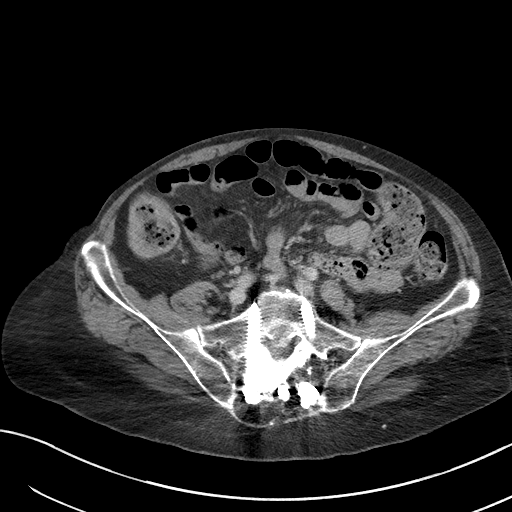
[im 37/77  soft-tissue]
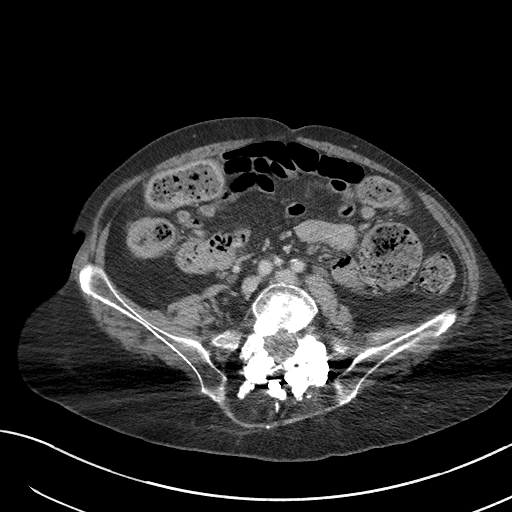
[im 41/77  soft-tissue]
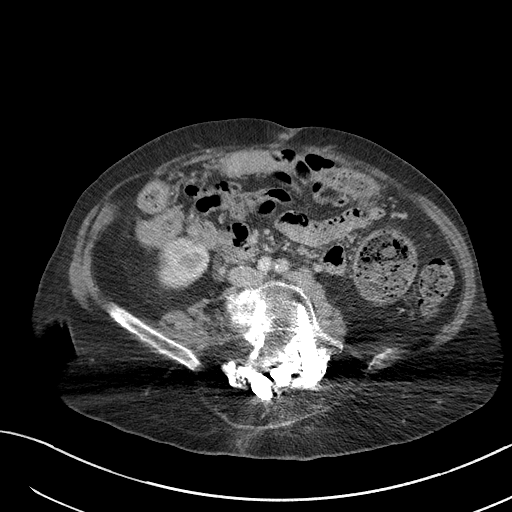
[im 45/77  soft-tissue]
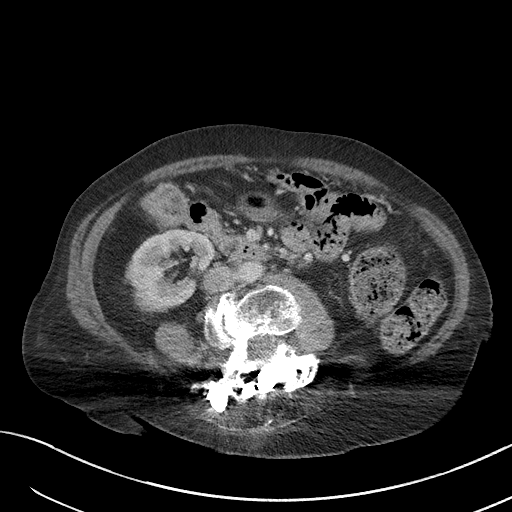
[im 45/77  bone]
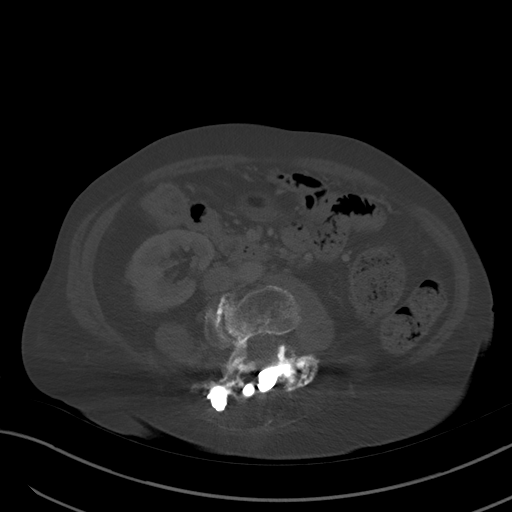
[im 53/77  soft-tissue]
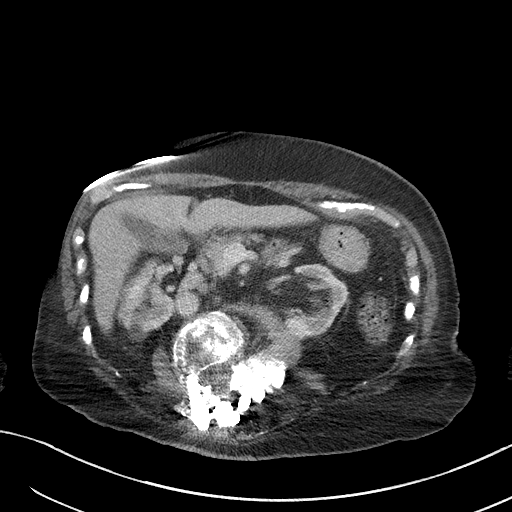
[im 57/77  soft-tissue]
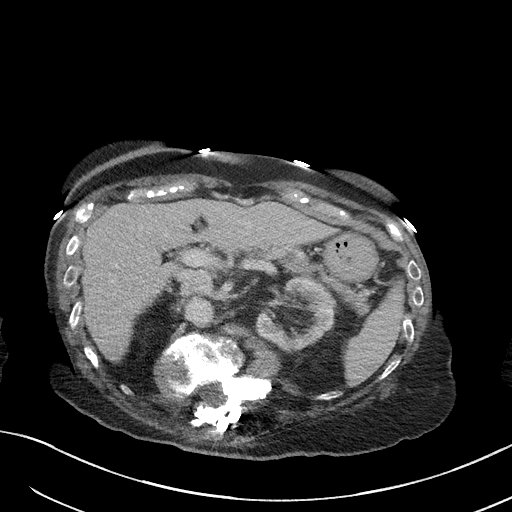
[im 61/77  soft-tissue]
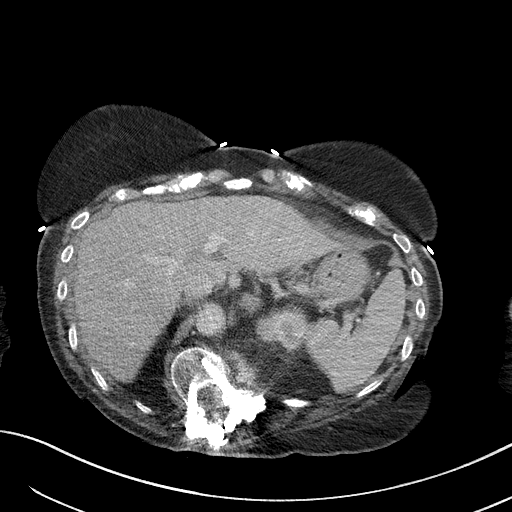
[im 69/77  soft-tissue]
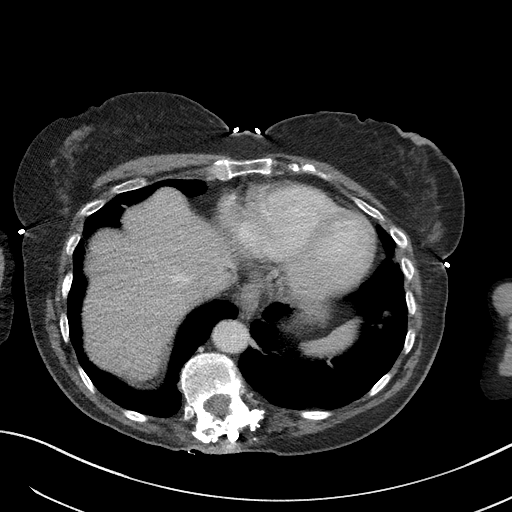
[im 73/77  soft-tissue]
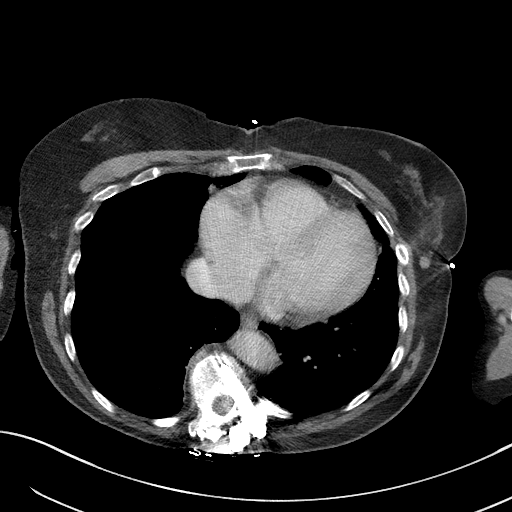

[Series 5: coronal st · coronal · 0.76mm/px · 3 of 82 slices shown]
[im 28/82  soft-tissue]
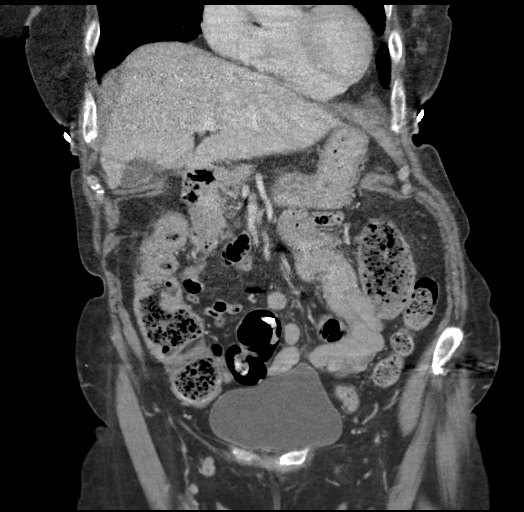
[im 37/82  soft-tissue]
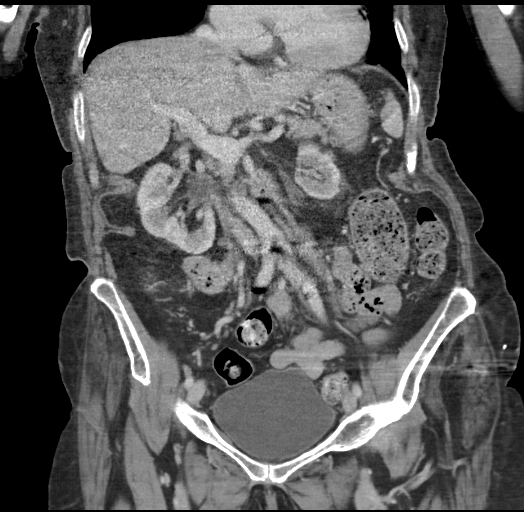
[im 46/82  soft-tissue]
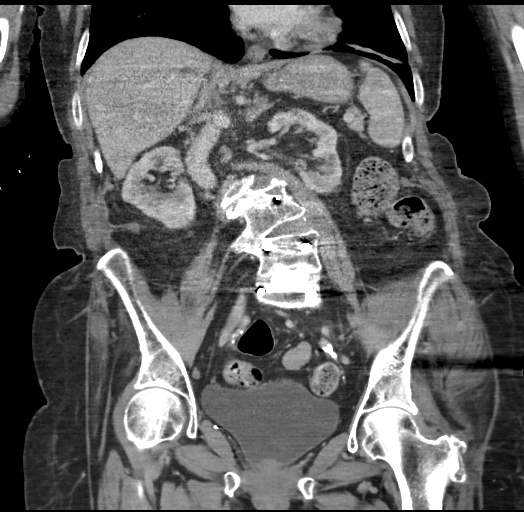

[17 of 46 positions shown; findings below may reference images not displayed]

FINDINGS: Lower chest: Minimal subpleural patchy opacities at the lung bases,
favoring sequela of prior atypical infection/inflammation.

Hepatobiliary: Liver is within normal limits.

Gallbladder is unremarkable. No intrahepatic or extrahepatic ductal
dilatation.

Pancreas: Within normal limits.

Spleen: Within normal limits.

Adrenals/Urinary Tract: Adrenal glands are within normal limits.

13 mm posterior right upper pole renal cyst. Left kidney is within
normal limits. No hydronephrosis.

Bladder is within normal limits.

Stomach/Bowel: Stomach is within normal limits.

No evidence of bowel obstruction.

Appendix is not discretely visualized.

Moderate left colonic stool burden, suggesting mild constipation.

No colonic wall thickening or inflammatory changes. Specifically, no
evidence of diverticulitis.

Vascular/Lymphatic: No evidence of abdominal aortic aneurysm.

Atherosclerotic calcifications of the abdominal aorta and branch
vessels.

No suspicious abdominopelvic lymphadenopathy.

Reproductive: Status post hysterectomy.

No adnexal masses.

Other: No abdominopelvic ascites.

Musculoskeletal: Lumbar dextroscoliosis with thoracolumbar fixation
rods.
IMPRESSION: No colonic wall thickening or inflammatory changes. Specifically, no
evidence of diverticulitis.

Moderate left colonic stool burden, suggesting mild constipation.

Additional ancillary findings as above.

## 2021-06-22 ENCOUNTER — Emergency Department (HOSPITAL_BASED_OUTPATIENT_CLINIC_OR_DEPARTMENT_OTHER)
Admission: EM | Admit: 2021-06-22 | Discharge: 2021-06-22 | Disposition: A | Discharge status: Home / Self Care | Payer: Medicare Other | Attending: Emergency Medicine | Admitting: Emergency Medicine

## 2021-06-22 ENCOUNTER — Encounter (HOSPITAL_BASED_OUTPATIENT_CLINIC_OR_DEPARTMENT_OTHER): Payer: Self-pay

## 2021-06-22 ENCOUNTER — Other Ambulatory Visit: Payer: Self-pay

## 2021-06-22 DIAGNOSIS — K5900 Constipation, unspecified: Secondary | ICD-10-CM | POA: Insufficient documentation

## 2021-06-22 DIAGNOSIS — Z5321 Procedure and treatment not carried out due to patient leaving prior to being seen by health care provider: Secondary | ICD-10-CM | POA: Insufficient documentation

## 2021-06-22 NOTE — ED Triage Notes (Signed)
[facility] reports pt with constipation x 1.5 weeks-no relief with OTC meds-pt A/O-was advised by PCP to come to ED-was brought from independent living at [facility]-pt agrees with report-NAD-to triage in w/c
